# Patient Record
Sex: Female | Born: 1990 | Race: White | Hispanic: Yes | Marital: Married | Smoking: Never smoker
Health system: Southern US, Community
[De-identification: ages and names within clinical notes are randomized; demographics above are authoritative.]

---

## 2020-03-27 ENCOUNTER — Emergency Department (HOSPITAL_COMMUNITY)
Admission: EM | Admit: 2020-03-27 | Discharge: 2020-03-27 | Disposition: A | Discharge status: Home / Self Care | Payer: Self-pay | Attending: Emergency Medicine | Admitting: Emergency Medicine

## 2020-03-27 ENCOUNTER — Emergency Department (HOSPITAL_COMMUNITY): Payer: Self-pay

## 2020-03-27 ENCOUNTER — Encounter (HOSPITAL_COMMUNITY): Payer: Self-pay | Admitting: Emergency Medicine

## 2020-03-27 DIAGNOSIS — S0990XA Unspecified injury of head, initial encounter: Secondary | ICD-10-CM | POA: Insufficient documentation

## 2020-03-27 DIAGNOSIS — S42109A Fracture of unspecified part of scapula, unspecified shoulder, initial encounter for closed fracture: Secondary | ICD-10-CM

## 2020-03-27 DIAGNOSIS — S42021A Displaced fracture of shaft of right clavicle, initial encounter for closed fracture: Secondary | ICD-10-CM | POA: Insufficient documentation

## 2020-03-27 DIAGNOSIS — S22009A Unspecified fracture of unspecified thoracic vertebra, initial encounter for closed fracture: Secondary | ICD-10-CM

## 2020-03-27 DIAGNOSIS — Y9289 Other specified places as the place of occurrence of the external cause: Secondary | ICD-10-CM | POA: Insufficient documentation

## 2020-03-27 DIAGNOSIS — Y9301 Activity, walking, marching and hiking: Secondary | ICD-10-CM | POA: Insufficient documentation

## 2020-03-27 DIAGNOSIS — S129XXA Fracture of neck, unspecified, initial encounter: Secondary | ICD-10-CM

## 2020-03-27 DIAGNOSIS — W108XXA Fall (on) (from) other stairs and steps, initial encounter: Secondary | ICD-10-CM | POA: Insufficient documentation

## 2020-03-27 MED ORDER — HYDROCODONE-ACETAMINOPHEN 5-325 MG PO TABS
1.0000 | ORAL_TABLET | Freq: Four times a day (QID) | ORAL | 0 refills | Status: DC | PRN
Start: 2020-03-27 — End: 2020-04-22

## 2020-03-27 NOTE — Discharge Instructions (Addendum)
Llama la ofcina debajo para dar una cita por el [place].  [place] el esling por dolor. [place] hielo, 20 minutes 3 veces cada dia.  Toma ibuprofen 3 veces cada dia. Tylenol si necesita mas medicina.  Toma norco si tiene Facilities manager. Ten cuidado, puede causar cansado.  Regresa a la sala de emergencia si no puede sentir el mano, si el piel de mano cambia color, o con nuevos sintomas.

## 2020-03-27 NOTE — ED Provider Notes (Signed)
[facility] EMERGENCY DEPARTMENT Provider Note   CSN: [id number] Arrival date & time: 03/27/20  1227     History Chief Complaint  Patient presents with   Fall   Shoulder Pain    Dareen Gutzwiller is a 29 y.o. female presenting for evaluation of right shoulder pain.  Patient states on Wednesday she fell down the stairs, landing on her left side.  She did hit her head, but did not lose consciousness.  She was able to stand up and ambulate immediately after the fall.  Since then, she has had persistent right shoulder pain.  Pain is minimal at rest, but severe with any movement.  She bought something over-the-counter for pain, but does not remember what it was.  She denies numbness or tingling.  She denies pain in her neck or her back.  She denies numbness or tingling.  She has never seen orthopedics before.  She denies assault or intentional injury.  She feels safe at home.  HPI     History reviewed. No pertinent past medical history.  There are no problems to display for this patient.   History reviewed. No pertinent surgical history.   OB History   No obstetric history on file.     No family history on file.  Social History   Tobacco Use   Smoking status: Never Smoker   Smokeless tobacco: Never Used  Substance Use Topics   Alcohol use: Not Currently   Drug use: Not Currently    Home Medications Prior to Admission medications   Not on File    Allergies    Patient has no allergy information on record.  Review of Systems   Review of Systems  Musculoskeletal: Positive for arthralgias.  Hematological: Does not bruise/bleed easily.  All other systems reviewed and are negative.   Physical Exam Updated Vital Signs BP 114/65 (BP Location: Left Arm)    Pulse 80    Temp 98.6 F (37 C) (Oral)    Resp 20    LMP 03/22/2020    SpO2 100%   Physical Exam Vitals and nursing note reviewed.  Constitutional:      General: She is not in  acute distress.    Appearance: She is well-developed.     Comments: Resting in the bed in NAD  HENT:     Head: Normocephalic.      Comments: Contusion under the right eye.  Healing laceration over the left eyebrow and along the bridge of the left side of the nose.  No trismus or malocclusion. Eyes:     Extraocular Movements: Extraocular movements intact.     Conjunctiva/sclera: Conjunctivae normal.     Pupils: Pupils are equal, round, and reactive to light.  Neck:     Comments: No ttp of midline c-spine Cardiovascular:     Rate and Rhythm: Normal rate and regular rhythm.     Pulses: Normal pulses.  Pulmonary:     Effort: Pulmonary effort is normal. No respiratory distress.     Breath sounds: Normal breath sounds. No wheezing.     Comments: Clear lung sounds. No ttp of the chest wall Chest:     Chest wall: No tenderness.  Abdominal:     General: There is no distension.     Palpations: Abdomen is soft. There is no mass.     Tenderness: There is no abdominal tenderness. There is no guarding or rebound.     Comments: No ttp  Musculoskeletal:  General: Swelling and tenderness present.     Cervical back: Normal range of motion and neck supple.     Comments: Swelling and tenderness palpation of the right shoulder.  Pain mostly along the clavicle and posterior shoulder, no pain over the humerus. Mild bruising and tenderness over the right ulnar wrist, however full active range of motion without difficulty.  Radial pulse 2+.  Good distal sensation and cap refill of the right upper extremity.  Full active range of motion of the elbow without difficulty. No TTP of midline spine or left side back.  Skin:    General: Skin is warm and dry.     Capillary Refill: Capillary refill takes less than 2 seconds.  Neurological:     Mental Status: She is alert and oriented to person, place, and time.     ED Results / Procedures / Treatments   Labs (all labs ordered are listed, but only  abnormal results are displayed) Labs Reviewed - No data to display  EKG None  Radiology DG Shoulder Right  Result Date: 03/27/2020 CLINICAL DATA:  Shoulder pain status post fall. EXAM: RIGHT SHOULDER - 2+ VIEW COMPARISON:  None. FINDINGS: There is a dislocated right mid clavicle fracture. Additionally, there appears to be fracture of superior aspect of the right scapula. Visualized right hemithorax is unremarkable. IMPRESSION: 1. Displaced right mid clavicle fracture. 2. Probable fracture of the superior aspect of the right scapula. Displaced right mid clavicle fracture. 3. Additionally, probable fracture of the superior aspect of the right scapula. Recommend further evaluation with shoulder CT. Electronically Signed   By: Annia Belt M.D.   On: 03/27/2020 14:18    Procedures Procedures (including critical care time)  Medications Ordered in ED Medications - No data to display  ED Course  I have reviewed the triage vital signs and the nursing notes.  Pertinent labs & imaging results that were available during my care of the patient were reviewed by me and considered in my medical decision making (see chart for details).    MDM Rules/Calculators/A&P                          Patient resenting for evaluation of right shoulder pain after fall 3 days ago.  On exam, patient is neurovascular intact, however does have tenderness of the clavicle and scapula.  X-ray obtained from triage viewed interpreted by me, showed just displaced clavicular fracture as well as what appears to be a scapular fracture.  Will obtain CT due to degree of displacement and consult with Ortho.  Patient states she does not need anything for pain when she is at rest.  Discussed with Dr. Carola Frost, orthopedics, who request 3D reconstruction.  Recommend patient be placed in a sling and follow-up in the office on Wednesday.  CT shows displaced R clavicle fx, nondisplaced fx of the spine, and multiple R transverse fxs of the  spine. Will add on dedicated c-spine and ct head imaging.  Disucssed results of shoulder CT with pt.   Pt signed out to Burnett Corrente, PA-C for f/u on head and c-spine ct. Consider neurosurgery consult as needed prior to d/c.   Final Clinical Impression(s) / ED Diagnoses Final diagnoses:  None    Rx / DC Orders ED Discharge Orders    None       Alveria Apley, PA-C 03/27/20 1621    Tilden Fossa, MD 03/27/20 1735

## 2020-03-27 NOTE — ED Provider Notes (Signed)
Care assumed from PA Sophia Caccavale at shift change, please see her note for full details, but in brief Alexandra Whitaker is a 29 y.o. female presents after she reports that she fell down the stairs 3 days ago.  Patient found to have right clavicle and scapular fractures as well as transverse process fractures of C7, T1 and T2.  Patient has some bruising beneath the right eye and an old healed cut above the left.  She does not have any tenderness over the chest or abdomen.  She is neurologically intact with no numbness or weakness.  Dedicated CTs of the head and C-spine pending at shift change.  Patient repeatedly states that she fell down the stairs and denies anyone hurting her, states that she feels safe to go home and does not need resources for safety.  DG Shoulder Right  Result Date: 03/27/2020 CLINICAL DATA:  Shoulder pain status post fall. EXAM: RIGHT SHOULDER - 2+ VIEW COMPARISON:  None. FINDINGS: There is a dislocated right mid clavicle fracture. Additionally, there appears to be fracture of superior aspect of the right scapula. Visualized right hemithorax is unremarkable. IMPRESSION: 1. Displaced right mid clavicle fracture. 2. Probable fracture of the superior aspect of the right scapula. Displaced right mid clavicle fracture. 3. Additionally, probable fracture of the superior aspect of the right scapula. Recommend further evaluation with shoulder CT. Electronically Signed   By: Annia Belt M.D.   On: 03/27/2020 14:18   CT Head Wo Contrast  Result Date: 03/27/2020 CLINICAL DATA:  Head trauma, mod-severe EXAM: CT HEAD WITHOUT CONTRAST TECHNIQUE: Contiguous axial images were obtained from the base of the skull through the vertex without intravenous contrast. COMPARISON:  None. FINDINGS: Brain: No intracranial hemorrhage, mass effect, or midline shift. No hydrocephalus. Incidental mega cisterna magna. The basilar cisterns are patent. No evidence of territorial infarct or acute ischemia. No  extra-axial or intracranial fluid collection. Vascular: No hyperdense vessel or unexpected calcification. Skull: Normal. Negative for fracture or focal lesion. Sinuses/Orbits: Mucosal thickening of the right maxillary sinus and scattered ethmoid air cells. Mastoid air cells are clear. No acute orbital findings. Other: None. IMPRESSION: 1. No acute intracranial abnormality. No skull fracture. 2. Mild paranasal sinus disease. Electronically Signed   By: Narda Rutherford M.D.   On: 03/27/2020 16:51   CT Cervical Spine Wo Contrast  Result Date: 03/27/2020 CLINICAL DATA:  Fall on Wednesday. Spinal fracture seen on CT of right shoulder. EXAM: CT CERVICAL SPINE WITHOUT CONTRAST TECHNIQUE: Multidetector CT imaging of the cervical spine was performed without intravenous contrast. Multiplanar CT image reconstructions were also generated. COMPARISON:  None. FINDINGS: Alignment: Straightening of normal lordosis. No traumatic subluxation. Skull base and vertebrae: Acute fractures of right C7, T1, and T2 transverse process. No extension to the lamina or anterior column. No additional fracture of the cervical spine. Dens and skull base are intact. Soft tissues and spinal canal: No prevertebral fluid or swelling. No visible canal hematoma. Disc levels:  Disc spaces are preserved. Upper chest: No acute findings.  No pneumothorax. Other: None. IMPRESSION: Acute fractures of right C7, T1, and T2 transverse process. No extension to the lamina or anterior column. No additional fracture of the cervical spine. Electronically Signed   By: Narda Rutherford M.D.   On: 03/27/2020 16:48   CT Shoulder Right Wo Contrast  Result Date: 03/27/2020 CLINICAL DATA:  Right shoulder trauma. Evaluate scapula and clavicle fractures. EXAM: CT OF THE UPPER RIGHT EXTREMITY WITHOUT CONTRAST TECHNIQUE: Multidetector CT imaging of the  upper right extremity was performed according to the standard protocol. COMPARISON:  Radiographs, same date. FINDINGS:  Displaced mid right clavicle fracture is again demonstrated. Marked elevation of the proximal fragment. The [facility] joint is intact and the coracoclavicular ligaments are intact. The fragments are overlapping by approximately 2.4 cm. Nondisplaced fracture involving the scapular spine. There is also a mildly comminuted but nondisplaced fracture of the scapular body. No definite rib fractures are identified but there are nondisplaced fractures involving the right transverse processes of C7, T1 and T2. Grossly by CT the rotator cuff tendons are intact. The biceps tendon appears intact. The right lung is clear.  No pulmonary contusion or pneumothorax. IMPRESSION: 1. Displaced mid right clavicle fracture. Marked elevation of the proximal fragment. 2. Nondisplaced fracture involving the scapular spine. 3. Mildly comminuted but nondisplaced fracture of the scapular body. 4. Nondisplaced fractures involving the right transverse processes of C7, T1 and T2. 5. Grossly by CT the rotator cuff tendons are intact. Electronically Signed   By: Rudie Meyer M.D.   On: 03/27/2020 15:57   CT 3D RECON AT SCANNER  Result Date: 03/27/2020 CLINICAL DATA:  Right shoulder trauma. Evaluate scapula and clavicle fractures. EXAM: CT OF THE UPPER RIGHT EXTREMITY WITHOUT CONTRAST TECHNIQUE: Multidetector CT imaging of the upper right extremity was performed according to the standard protocol. COMPARISON:  Radiographs, same date. FINDINGS: Displaced mid right clavicle fracture is again demonstrated. Marked elevation of the proximal fragment. The [facility] joint is intact and the coracoclavicular ligaments are intact. The fragments are overlapping by approximately 2.4 cm. Nondisplaced fracture involving the scapular spine. There is also a mildly comminuted but nondisplaced fracture of the scapular body. No definite rib fractures are identified but there are nondisplaced fractures involving the right transverse processes of C7, T1 and T2. Grossly by CT the  rotator cuff tendons are intact. The biceps tendon appears intact. The right lung is clear.  No pulmonary contusion or pneumothorax. IMPRESSION: 1. Displaced mid right clavicle fracture. Marked elevation of the proximal fragment. 2. Nondisplaced fracture involving the scapular spine. 3. Mildly comminuted but nondisplaced fracture of the scapular body. 4. Nondisplaced fractures involving the right transverse processes of C7, T1 and T2. 5. Grossly by CT the rotator cuff tendons are intact. Electronically Signed   By: Rudie Meyer M.D.   On: 03/27/2020 15:57     CTs of the cervical spine and head with no other acute injury.  Fractures of the right C7, T1 and T2 transverse processes again noted with no extension to the lamina or anterior column, no additional fractures of the C-spine noted.  I discussed these injuries with Dr. Maisie Fus with neurosurgery that states that this does not require any c-collar or bracing and that these should heal fine on their own they are not unstable and will not require any surgical intervention.  Patient can follow-up with her PCP regarding this.  Patient has been prescribed Norco and encouraged to use ibuprofen for pain management and has follow-up with Dr. Carola Frost for clavicle and scapular fracture.  Using Spanish interpreter I again confirmed with patient that she feels safe going home, her brother is waiting here in the parking lot for her.  She denies any concern for her safety.  She expresses understanding with pain medication and plan for follow-up.  Shoulder sling intact.  Discharged home in good condition.    Dartha Lodge, PA-C 03/27/20 1750    Tilden Fossa, MD 03/27/20 1815

## 2020-03-27 NOTE — ED Notes (Signed)
Patient verbalizes understanding of discharge instructions. Opportunity for questioning and answers were provided. Pt discharged from ED. 

## 2020-03-27 NOTE — ED Triage Notes (Signed)
Pt. Stated, I fell on Wednesday and feel like I broke my rt. Shoulder.

## 2020-03-27 NOTE — Progress Notes (Signed)
Orthopedic Tech Progress Note Patient Details:  Alexandra Whitaker [birth year] [id number] Sling Immobilizer applied by ED tech Patient ID: [name], female   DOB: [birth year], 29 y.o.   MRN: [id number]   [name] 03/27/2020, 3:46 PM

## 2020-04-17 ENCOUNTER — Inpatient Hospital Stay (HOSPITAL_COMMUNITY): Admission: RE | Admit: 2020-04-17 | Payer: Self-pay | Source: Ambulatory Visit

## 2020-04-19 NOTE — Progress Notes (Signed)
[employer]  ID [phone] to update patient on new surgery start time of  9:47 am and arrival time at [facility] at 715 am.

## 2020-04-19 NOTE — H&P (Signed)
Orthopaedic Trauma Service (OTS) Consult/H&P   Patient ID: Alexandra Whitaker MRN: [id number] DOB/AGE: 1990/09/16 29 y.o.   HPI: Alexandra Whitaker is an 29 y.o. RHD female who sustained a right clavicle fracture approximately 3 weeks ago.  Patient was seen in the ED and placed into a sling.  She was instructed to follow-up with orthopedics within a week however she did not present to our office until last week.  Follow-up x-rays were obtained which showed over 300% displacement along with a pretty profound shortening.  Patient is quite uncomfortable.  We discussed risks and benefits of surgery versus nonoperative management and she wished to proceed with surgical intervention.  Patient presents today for open reduction internal fixation of her right clavicle.  Comprehensive H&P done as part of her evaluation in the office.  No acute changes since being seen in the office.  No numbness or tingling in her right upper extremity.  She has been using her sling.  Pain is tolerable.  No past medical history on file.  No past surgical history on file.  No family history on file.  Social History:  reports that she has never smoked. She has never used smokeless tobacco. She reports previous alcohol use. She reports previous drug use.  Allergies: Not on File  Medications: I have reviewed the patient's current medications. No outpatient medications have been marked as taking for the 04/20/20 encounter [facility] Encounter).     No results found for this or any previous visit (from the past 48 hour(s)).  No results found.  Intake/Output    None      Review of Systems  Constitutional: Negative for chills and fever.  Respiratory: Negative for shortness of breath and wheezing.   Cardiovascular: Negative for chest pain and palpitations.  Gastrointestinal: Negative for abdominal pain, nausea and vomiting.  Musculoskeletal:       R shoulder pain   Neurological: Negative for  tingling and sensory change.   Last menstrual period 03/22/2020. Physical Exam Vitals and nursing note reviewed.  Constitutional:      General: She is awake. She is not in acute distress.    Appearance: Normal appearance. She is well-developed, well-groomed and normal weight.  HENT:     Head: Normocephalic and atraumatic.  Eyes:     Extraocular Movements: Extraocular movements intact.  Cardiovascular:     Rate and Rhythm: Normal rate and regular rhythm.  Pulmonary:     Effort: Pulmonary effort is normal.     Breath sounds: Normal breath sounds.  Musculoskeletal:     Comments: Right upper extremity  + deformity of R shoulder    + shortening of R shoulder girdle    + elevation of R shoulder     + asymmetry when compared to contra-lateral side  No complex wounds No skin tenting  Skin mobile over fracture side mid clavicle Fracture is mobile +TTP with palpation  Elbow, forearm, wrist and hand are nontender + radial pulse Radial, ulnar, median nv motor and sensory functions intact   Skin:    General: Skin is warm.     Capillary Refill: Capillary refill takes less than 2 seconds.  Neurological:     Mental Status: She is alert and oriented to person, place, and time.  Psychiatric:        Attention and Perception: Attention and perception normal.        Mood and Affect: Mood and affect normal.  Behavior: Behavior is cooperative.    Xrays R clavicle      Shortened, displaced and comminuted midshaft R clavicle fracture.  Approximately 300% caudal displacement   Assessment/Plan:  29 y/o RHD female with displaced R clavicle fracture   - R clavicle fracture   OR for ORIF   outpt procedure  Risks and benefits reviewed with pt, wishes to proceed with ORIF   ROM as tolerated post op, sling for comfort   - Dispo:  OR for ORIF R clavicle    Mearl Latin, PA-C [phone] (C) 04/19/2020, 1:55 PM  [facility] [address] [place]  [address] [phone] [phone] (F)    After 5pm and on the weekends please log on to Amion, go to orthopaedics and the look under the [facility] Call for the provider(s) on call. You can also call our office at [phone] and then follow the prompts to be connected to the call team.

## 2020-04-20 ENCOUNTER — Ambulatory Visit (HOSPITAL_COMMUNITY): Payer: Self-pay

## 2020-04-20 ENCOUNTER — Ambulatory Visit (HOSPITAL_COMMUNITY)
Admission: RE | Admit: 2020-04-20 | Discharge: 2020-04-20 | Disposition: A | Discharge status: Home / Self Care | Payer: Self-pay | Attending: Orthopedic Surgery | Admitting: Orthopedic Surgery

## 2020-04-20 ENCOUNTER — Ambulatory Visit (HOSPITAL_COMMUNITY): Payer: Self-pay | Admitting: Certified Registered Nurse Anesthetist

## 2020-04-20 ENCOUNTER — Encounter (HOSPITAL_COMMUNITY): Payer: Self-pay | Admitting: Orthopedic Surgery

## 2020-04-20 ENCOUNTER — Encounter (HOSPITAL_COMMUNITY)
Admission: RE | Disposition: A | Discharge status: Home / Self Care | Payer: Self-pay | Source: Home / Self Care | Attending: Orthopedic Surgery

## 2020-04-20 ENCOUNTER — Other Ambulatory Visit: Payer: Self-pay

## 2020-04-20 DIAGNOSIS — T148XXA Other injury of unspecified body region, initial encounter: Secondary | ICD-10-CM

## 2020-04-20 DIAGNOSIS — S42021A Displaced fracture of shaft of right clavicle, initial encounter for closed fracture: Secondary | ICD-10-CM | POA: Insufficient documentation

## 2020-04-20 DIAGNOSIS — W109XXA Fall (on) (from) unspecified stairs and steps, initial encounter: Secondary | ICD-10-CM | POA: Insufficient documentation

## 2020-04-20 DIAGNOSIS — Z20822 Contact with and (suspected) exposure to covid-19: Secondary | ICD-10-CM | POA: Insufficient documentation

## 2020-04-20 DIAGNOSIS — Z419 Encounter for procedure for purposes other than remedying health state, unspecified: Secondary | ICD-10-CM

## 2020-04-20 HISTORY — PX: ORIF CLAVICULAR FRACTURE: SHX5055

## 2020-04-20 LAB — CBC
HCT: 41.8 % (ref 36.0–46.0)
Hemoglobin: 12.9 g/dL (ref 12.0–15.0)
MCH: 25.2 pg — ABNORMAL LOW (ref 26.0–34.0)
MCHC: 30.9 g/dL (ref 30.0–36.0)
MCV: 81.6 fL (ref 80.0–100.0)
Platelets: 229 10*3/uL (ref 150–400)
RBC: 5.12 MIL/uL — ABNORMAL HIGH (ref 3.87–5.11)
RDW: 13.1 % (ref 11.5–15.5)
WBC: 7.2 10*3/uL (ref 4.0–10.5)
nRBC: 0 % (ref 0.0–0.2)

## 2020-04-20 LAB — COMPREHENSIVE METABOLIC PANEL
ALT: 10 U/L (ref 0–44)
AST: 16 U/L (ref 15–41)
Albumin: 4.1 g/dL (ref 3.5–5.0)
Alkaline Phosphatase: 58 U/L (ref 38–126)
Anion gap: 11 (ref 5–15)
BUN: 10 mg/dL (ref 6–20)
CO2: 23 mmol/L (ref 22–32)
Calcium: 9.2 mg/dL (ref 8.9–10.3)
Chloride: 103 mmol/L (ref 98–111)
Creatinine, Ser: 0.81 mg/dL (ref 0.44–1.00)
GFR, Estimated: >60 mL/min (ref >60)
Glucose, Bld: 101 mg/dL — ABNORMAL HIGH (ref 70–99)
Potassium: 3.8 mmol/L (ref 3.5–5.1)
Sodium: 137 mmol/L (ref 135–145)
Total Bilirubin: 0.5 mg/dL (ref 0.3–1.2)
Total Protein: 7.1 g/dL (ref 6.5–8.1)

## 2020-04-20 LAB — POCT PREGNANCY, URINE: Preg Test, Ur: NEGATIVE

## 2020-04-20 LAB — SARS CORONAVIRUS 2 BY RT PCR (HOSPITAL ORDER, PERFORMED IN ~~LOC~~ HOSPITAL LAB): SARS Coronavirus 2: NEGATIVE

## 2020-04-20 LAB — VITAMIN D 25 HYDROXY (VIT D DEFICIENCY, FRACTURES): Vit D, 25-Hydroxy: 29.58 ng/mL — ABNORMAL LOW (ref 30–100)

## 2020-04-20 SURGERY — OPEN REDUCTION INTERNAL FIXATION (ORIF) CLAVICULAR FRACTURE
Anesthesia: General | Site: Chest | Laterality: Right

## 2020-04-20 MED ORDER — ROCURONIUM BROMIDE 10 MG/ML (PF) SYRINGE
PREFILLED_SYRINGE | INTRAVENOUS | Status: AC
  Filled 2020-04-20: qty 10

## 2020-04-20 MED ORDER — OXYCODONE HCL 5 MG/5ML PO SOLN: 5.0000 mg | Freq: Once | ORAL | Status: DC | PRN

## 2020-04-20 MED ORDER — KETOROLAC TROMETHAMINE 30 MG/ML IJ SOLN: 30.0000 mg | Freq: Once | INTRAMUSCULAR | Status: AC | PRN

## 2020-04-20 MED ORDER — CEFAZOLIN SODIUM-DEXTROSE 2-4 GM/100ML-% IV SOLN
2.0000 g | INTRAVENOUS | Status: AC
  Administered 2020-04-20: 2 g via INTRAVENOUS
  Filled 2020-04-20: qty 100

## 2020-04-20 MED ORDER — FENTANYL CITRATE (PF) 250 MCG/5ML IJ SOLN
INTRAMUSCULAR | Status: AC
  Filled 2020-04-20: qty 5

## 2020-04-20 MED ORDER — GABAPENTIN 300 MG PO CAPS
300.0000 mg | ORAL_CAPSULE | Freq: Once | ORAL | Status: AC
  Administered 2020-04-20: 300 mg via ORAL
  Filled 2020-04-20: qty 1

## 2020-04-20 MED ORDER — OXYCODONE HCL 5 MG PO TABS: 5.0000 mg | ORAL_TABLET | Freq: Once | ORAL | Status: DC | PRN

## 2020-04-20 MED ORDER — DEXAMETHASONE SODIUM PHOSPHATE 10 MG/ML IJ SOLN
INTRAMUSCULAR | Status: DC | PRN
  Administered 2020-04-20: 8 mg via INTRAVENOUS

## 2020-04-20 MED ORDER — HYDROCODONE-ACETAMINOPHEN 5-325 MG PO TABS
1.0000 | ORAL_TABLET | Freq: Four times a day (QID) | ORAL | 0 refills | Status: AC | PRN
Start: 2020-04-20 — End: ?

## 2020-04-20 MED ORDER — ONDANSETRON 4 MG PO TBDP: 4.0000 mg | ORAL_TABLET | Freq: Three times a day (TID) | ORAL | 0 refills | Status: AC | PRN

## 2020-04-20 MED ORDER — CELECOXIB 200 MG PO CAPS
400.0000 mg | ORAL_CAPSULE | Freq: Once | ORAL | Status: AC
  Administered 2020-04-20: 400 mg via ORAL
  Filled 2020-04-20: qty 2

## 2020-04-20 MED ORDER — ONDANSETRON HCL 4 MG/2ML IJ SOLN
INTRAMUSCULAR | Status: AC
  Filled 2020-04-20: qty 2

## 2020-04-20 MED ORDER — LACTATED RINGERS IV SOLN: INTRAVENOUS | Status: DC

## 2020-04-20 MED ORDER — LIDOCAINE 2% (20 MG/ML) 5 ML SYRINGE
INTRAMUSCULAR | Status: DC | PRN
  Administered 2020-04-20: 60 mg via INTRAVENOUS

## 2020-04-20 MED ORDER — 0.9 % SODIUM CHLORIDE (POUR BTL) OPTIME
TOPICAL | Status: DC | PRN
  Administered 2020-04-20: 1000 mL

## 2020-04-20 MED ORDER — CHLORHEXIDINE GLUCONATE 0.12 % MT SOLN
15.0000 mL | Freq: Once | OROMUCOSAL | Status: AC
  Administered 2020-04-20: 15 mL via OROMUCOSAL
  Filled 2020-04-20: qty 15

## 2020-04-20 MED ORDER — ROCURONIUM BROMIDE 10 MG/ML (PF) SYRINGE
PREFILLED_SYRINGE | INTRAVENOUS | Status: DC | PRN
  Administered 2020-04-20: 100 mg via INTRAVENOUS

## 2020-04-20 MED ORDER — ORAL CARE MOUTH RINSE: 15.0000 mL | Freq: Once | OROMUCOSAL | Status: AC

## 2020-04-20 MED ORDER — VITAMIN D 125 MCG (5000 UT) PO CAPS: 1.0000 | ORAL_CAPSULE | Freq: Every day | ORAL | 5 refills | Status: AC

## 2020-04-20 MED ORDER — ONDANSETRON HCL 4 MG/2ML IJ SOLN
INTRAMUSCULAR | Status: DC | PRN
  Administered 2020-04-20: 4 mg via INTRAVENOUS

## 2020-04-20 MED ORDER — MIDAZOLAM HCL 2 MG/2ML IJ SOLN
INTRAMUSCULAR | Status: AC
  Filled 2020-04-20: qty 2

## 2020-04-20 MED ORDER — HYDROMORPHONE HCL 1 MG/ML IJ SOLN: 0.2500 mg | INTRAMUSCULAR | Status: DC | PRN

## 2020-04-20 MED ORDER — FENTANYL CITRATE (PF) 100 MCG/2ML IJ SOLN
INTRAMUSCULAR | Status: DC | PRN
  Administered 2020-04-20 (×2): 50 ug via INTRAVENOUS
  Administered 2020-04-20: 100 ug via INTRAVENOUS
  Administered 2020-04-20: 50 ug via INTRAVENOUS

## 2020-04-20 MED ORDER — PHENYLEPHRINE 40 MCG/ML (10ML) SYRINGE FOR IV PUSH (FOR BLOOD PRESSURE SUPPORT)
PREFILLED_SYRINGE | INTRAVENOUS | Status: AC
  Filled 2020-04-20: qty 10

## 2020-04-20 MED ORDER — PROMETHAZINE HCL 25 MG/ML IJ SOLN: 6.2500 mg | INTRAMUSCULAR | Status: DC | PRN

## 2020-04-20 MED ORDER — SODIUM CHLORIDE (PF) 0.9 % IJ SOLN
INTRAMUSCULAR | Status: DC | PRN
  Administered 2020-04-20: 20 mL

## 2020-04-20 MED ORDER — LIDOCAINE 2% (20 MG/ML) 5 ML SYRINGE
INTRAMUSCULAR | Status: AC
  Filled 2020-04-20: qty 5

## 2020-04-20 MED ORDER — PROPOFOL 10 MG/ML IV BOLUS
INTRAVENOUS | Status: DC | PRN
  Administered 2020-04-20: 120 mg via INTRAVENOUS

## 2020-04-20 MED ORDER — VITAMIN C 1000 MG PO TABS: 1000.0000 mg | ORAL_TABLET | Freq: Every day | ORAL | 0 refills | Status: AC

## 2020-04-20 MED ORDER — ACETAMINOPHEN 500 MG PO TABS
1000.0000 mg | ORAL_TABLET | Freq: Once | ORAL | Status: AC
  Administered 2020-04-20: 1000 mg via ORAL
  Filled 2020-04-20: qty 2

## 2020-04-20 MED ORDER — MIDAZOLAM HCL 2 MG/2ML IJ SOLN
INTRAMUSCULAR | Status: DC | PRN
  Administered 2020-04-20: 2 mg via INTRAVENOUS

## 2020-04-20 MED ORDER — DEXMEDETOMIDINE (PRECEDEX) IN NS 20 MCG/5ML (4 MCG/ML) IV SYRINGE
PREFILLED_SYRINGE | INTRAVENOUS | Status: DC | PRN
  Administered 2020-04-20: 12 ug via INTRAVENOUS
  Administered 2020-04-20: 8 ug via INTRAVENOUS

## 2020-04-20 MED ORDER — BUPIVACAINE LIPOSOME 1.3 % IJ SUSP
20.0000 mL | Freq: Once | INTRAMUSCULAR | Status: AC
  Administered 2020-04-20: 20 mL
  Filled 2020-04-20: qty 20

## 2020-04-20 MED ORDER — DEXAMETHASONE SODIUM PHOSPHATE 10 MG/ML IJ SOLN
INTRAMUSCULAR | Status: AC
  Filled 2020-04-20: qty 1

## 2020-04-20 MED ORDER — METHOCARBAMOL 500 MG PO TABS
500.0000 mg | ORAL_TABLET | Freq: Three times a day (TID) | ORAL | 0 refills | Status: AC | PRN
Start: 2020-04-20 — End: ?

## 2020-04-20 MED ORDER — SUGAMMADEX SODIUM 200 MG/2ML IV SOLN
INTRAVENOUS | Status: DC | PRN
  Administered 2020-04-20: 200 mg via INTRAVENOUS

## 2020-04-20 MED ORDER — MEPERIDINE HCL 25 MG/ML IJ SOLN: 6.2500 mg | INTRAMUSCULAR | Status: DC | PRN

## 2020-04-20 MED ORDER — PHENYLEPHRINE 40 MCG/ML (10ML) SYRINGE FOR IV PUSH (FOR BLOOD PRESSURE SUPPORT)
PREFILLED_SYRINGE | INTRAVENOUS | Status: DC | PRN
  Administered 2020-04-20: 80 ug via INTRAVENOUS

## 2020-04-20 SURGICAL SUPPLY — 53 items
BENZOIN TINCTURE PRP APPL 2/3 (GAUZE/BANDAGES/DRESSINGS) ×3 IMPLANT
BIT DRILL 2.0 LNG QUCK RELEASE (BIT) ×1 IMPLANT
BIT DRILL 2.8 QUICK RELEASE (BIT) ×1 IMPLANT
BRUSH SCRUB EZ PLAIN DRY (MISCELLANEOUS) ×6 IMPLANT
CLOSURE WOUND 1/2 X4 (GAUZE/BANDAGES/DRESSINGS) ×1
COVER SURGICAL LIGHT HANDLE (MISCELLANEOUS) ×3 IMPLANT
COVER WAND RF STERILE (DRAPES) ×3 IMPLANT
DRAPE C-ARM 42X72 X-RAY (DRAPES) ×3 IMPLANT
DRAPE INCISE IOBAN 66X45 STRL (DRAPES) ×3 IMPLANT
DRAPE ORTHO SPLIT 77X108 STRL (DRAPES) ×4
DRAPE SURG ORHT 6 SPLT 77X108 (DRAPES) ×2 IMPLANT
DRAPE U-SHAPE 47X51 STRL (DRAPES) ×3 IMPLANT
DRILL 2.0 LNG QUICK RELEASE (BIT) ×3
DRILL 2.8 QUICK RELEASE (BIT) ×3
DRSG MEPILEX BORDER 4X8 (GAUZE/BANDAGES/DRESSINGS) ×3 IMPLANT
ELECT REM PT RETURN 9FT ADLT (ELECTROSURGICAL) ×3
ELECTRODE REM PT RTRN 9FT ADLT (ELECTROSURGICAL) ×1 IMPLANT
GLOVE BIO SURGEON STRL SZ7.5 (GLOVE) ×6 IMPLANT
GLOVE BIO SURGEON STRL SZ8 (GLOVE) ×3 IMPLANT
GLOVE BIOGEL PI IND STRL 7.5 (GLOVE) ×2 IMPLANT
GLOVE BIOGEL PI IND STRL 8 (GLOVE) ×1 IMPLANT
GLOVE BIOGEL PI INDICATOR 7.5 (GLOVE) ×4
GLOVE BIOGEL PI INDICATOR 8 (GLOVE) ×2
GOWN STRL REUS W/ TWL LRG LVL3 (GOWN DISPOSABLE) ×3 IMPLANT
GOWN STRL REUS W/ TWL XL LVL3 (GOWN DISPOSABLE) ×1 IMPLANT
GOWN STRL REUS W/TWL LRG LVL3 (GOWN DISPOSABLE) ×6
GOWN STRL REUS W/TWL XL LVL3 (GOWN DISPOSABLE) ×2
KIT BASIN OR (CUSTOM PROCEDURE TRAY) ×3 IMPLANT
KIT TURNOVER KIT B (KITS) ×3 IMPLANT
MANIFOLD NEPTUNE II (INSTRUMENTS) ×3 IMPLANT
NEEDLE HYPO 25GX1X1/2 BEV (NEEDLE) ×3 IMPLANT
NS IRRIG 1000ML POUR BTL (IV SOLUTION) ×3 IMPLANT
PACK GENERAL/GYN (CUSTOM PROCEDURE TRAY) ×3 IMPLANT
PAD ARMBOARD 7.5X6 YLW CONV (MISCELLANEOUS) ×6 IMPLANT
PLATE DIST CLAV 2.3 16H RT (Plate) ×3 IMPLANT
SCREW 2.3X12MM (Screw) ×3 IMPLANT
SCREW CORTICAL 2.3X10MM (Screw) ×3 IMPLANT
SCREW CORTICAL LOCKING 2.3X14M (Screw) ×3 IMPLANT
SCREW HEXALOBE NON-LOCK 3.5X14 (Screw) ×3 IMPLANT
SCREW NON LOCK 3.5X10MM (Screw) ×3 IMPLANT
SCREW NON-TOGGLING 2.3X10 (Screw) ×3 IMPLANT
SCREW NONLOCK HEX 3.5X12 (Screw) ×9 IMPLANT
SLING ARM FOAM STRAP MED (SOFTGOODS) ×3 IMPLANT
SLING ARM IMMOBILIZER LRG (SOFTGOODS) IMPLANT
SLING ARM IMMOBILIZER MED (SOFTGOODS) IMPLANT
STAPLER VISISTAT 35W (STAPLE) ×3 IMPLANT
STRIP CLOSURE SKIN 1/2X4 (GAUZE/BANDAGES/DRESSINGS) ×2 IMPLANT
SUCTION FRAZIER HANDLE 10FR (MISCELLANEOUS) ×2
SUCTION TUBE FRAZIER 10FR DISP (MISCELLANEOUS) ×1 IMPLANT
SYR CONTROL 10ML LL (SYRINGE) ×3 IMPLANT
TOWEL GREEN STERILE (TOWEL DISPOSABLE) ×3 IMPLANT
TOWEL GREEN STERILE FF (TOWEL DISPOSABLE) ×6 IMPLANT
WATER STERILE IRR 1000ML POUR (IV SOLUTION) ×3 IMPLANT

## 2020-04-20 NOTE — Anesthesia Postprocedure Evaluation (Signed)
Anesthesia Post Note  Patient: Alexandra Whitaker  Procedure(s) Performed: OPEN REDUCTION INTERNAL FIXATION (ORIF) CLAVICULAR FRACTURE (Right Chest)     Patient location during evaluation: PACU Anesthesia Type: General Level of consciousness: awake and alert, oriented and patient cooperative Pain management: pain level controlled Vital Signs Assessment: post-procedure vital signs reviewed and stable Respiratory status: spontaneous breathing, nonlabored ventilation and respiratory function stable Cardiovascular status: blood pressure returned to baseline and stable Postop Assessment: no apparent nausea or vomiting Anesthetic complications: no   No complications documented.  Last Vitals:  Vitals:   04/20/20 1234 04/20/20 1247  BP: 117/77 121/80  Pulse: 93 91  Resp: 16 18  Temp:  36.7 C  SpO2: 100% 99%    Last Pain:  Vitals:   04/20/20 1247  TempSrc:   PainSc: 0-No pain                 Lannie Fields

## 2020-04-20 NOTE — Transfer of Care (Signed)
Immediate Anesthesia Transfer of Care Note  Patient: Alexandra Whitaker  Procedure(s) Performed: OPEN REDUCTION INTERNAL FIXATION (ORIF) CLAVICULAR FRACTURE (Right Chest)  Patient Location: PACU  Anesthesia Type:General  Level of Consciousness: awake, alert  and oriented  Airway & Oxygen Therapy: Patient Spontanous Breathing and Patient connected to nasal cannula oxygen  Post-op Assessment: Report given to RN, Post -op Vital signs reviewed and stable and Patient moving all extremities  Post vital signs: Reviewed and stable  Last Vitals:  Vitals Value Taken Time  BP 116/68 04/20/20 1219  Temp 36.6 C 04/20/20 1218  Pulse 113 04/20/20 1221  Resp 8 04/20/20 1221  SpO2 100 % 04/20/20 1221  Vitals shown include unvalidated device data.  Last Pain:  Vitals:   04/20/20 0828  TempSrc:   PainSc: 0-No pain         Complications: No complications documented.

## 2020-04-20 NOTE — Discharge Instructions (Signed)
[facility] Discharge Instructions   General Discharge Instructions  Orthopaedic Injuries:  Right clavicle fracture treated with open reduction and internal fixation using plate and screws  WEIGHT BEARING STATUS: ok to use right arm to comfort. No lifting more than 5 pounds.  Sling for comfort   RANGE OF MOTION/ACTIVITY:unrestricted range of motion Right shoulder. No lifting more than 5 pounds. Activity as tolerated while maintaining restriction  Bone health: labs show some mild vitamin d insufficiency. Take the vitamin d and vitamin c that has been prescribed for you   Wound Care: daily wound care starting on 04/23/2020. See below  Discharge Wound Care Instructions  Do NOT apply any ointments, solutions or lotions to pin sites or surgical wounds.  These prevent needed drainage and even though solutions like hydrogen peroxide kill bacteria, they also damage cells lining the pin sites that help fight infection.  Applying lotions or ointments can keep the wounds moist and can cause them to breakdown and open up as well. This can increase the risk for infection. When in doubt call the office.  Surgical incisions should be dressed daily.  If any drainage is noted, use one layer of adaptic, then gauze and tape. Alternatively you can use a mepilex type dressing (the beige dressing you have on your incision from the hospital). These can be purchased at a medical supply store [facility] in [place])   Once the incision is completely dry and without drainage, it may be left open to air out.  Showering may begin 36-48 hours later.  Cleaning gently with soap and water.   Diet: as you were eating previously.  Can use over the counter stool softeners and bowel preparations, such as Miralax, to help with bowel movements.  Narcotics can be constipating.  Be sure to drink plenty of fluids  PAIN MEDICATION USE AND EXPECTATIONS  You have likely been given narcotic medications to  help control your pain.  After a traumatic event that results in an fracture (broken bone) with or without surgery, it is ok to use narcotic pain medications to help control one's pain.  We understand that everyone responds to pain differently and each individual patient will be evaluated on a regular basis for the continued need for narcotic medications. Ideally, narcotic medication use should last no more than 6-8 weeks (coinciding with fracture healing).   As a patient it is your responsibility as well to monitor narcotic medication use and report the amount and frequency you use these medications when you come to your office visit.   We would also advise that if you are using narcotic medications, you should take a dose prior to therapy to maximize you participation.  IF YOU ARE ON NARCOTIC MEDICATIONS IT IS NOT PERMISSIBLE TO OPERATE A MOTOR VEHICLE (MOTORCYCLE/CAR/TRUCK/MOPED) OR HEAVY MACHINERY DO NOT MIX NARCOTICS WITH OTHER CNS (CENTRAL NERVOUS SYSTEM) DEPRESSANTS SUCH AS ALCOHOL   STOP SMOKING OR USING NICOTINE PRODUCTS!!!!  As discussed nicotine severely impairs your body's ability to heal surgical and traumatic wounds but also impairs bone healing.  Wounds and bone heal by forming microscopic blood vessels (angiogenesis) and nicotine is a vasoconstrictor (essentially, shrinks blood vessels).  Therefore, if vasoconstriction occurs to these microscopic blood vessels they essentially disappear and are unable to deliver necessary nutrients to the healing tissue.  This is one modifiable factor that you can do to dramatically increase your chances of healing your injury.    (This means no smoking, no nicotine gum, patches, etc)  DO NOT USE NONSTEROIDAL ANTI-INFLAMMATORY DRUGS (NSAID'S)  Using products such as Advil (ibuprofen), Aleve (naproxen), Motrin (ibuprofen) for additional pain control during fracture healing can delay and/or prevent the healing response.  If you would like to take over the  counter (OTC) medication, Tylenol (acetaminophen) is ok.  However, some narcotic medications that are given for pain control contain acetaminophen as well. Therefore, you should not exceed more than 4000 mg of tylenol in a day if you do not have liver disease.  Also note that there are may OTC medicines, such as cold medicines and allergy medicines that my contain tylenol as well.  If you have any questions about medications and/or interactions please ask your doctor/PA or your pharmacist.      ICE AND ELEVATE INJURED/OPERATIVE EXTREMITY  Using ice and elevating the injured extremity above your heart can help with swelling and pain control.  Icing in a pulsatile fashion, such as 20 minutes on and 20 minutes off, can be followed.    Do not place ice directly on skin. Make sure there is a barrier between to skin and the ice pack.    Using frozen items such as frozen peas works well as the conform nicely to the are that needs to be iced.  USE AN ACE WRAP OR TED HOSE FOR SWELLING CONTROL  In addition to icing and elevation, Ace wraps or TED hose are used to help limit and resolve swelling.  It is recommended to use Ace wraps or TED hose until you are informed to stop.    When using Ace Wraps start the wrapping distally (farthest away from the body) and wrap proximally (closer to the body)   Example: If you had surgery on your leg or thing and you do not have a splint on, start the ace wrap at the toes and work your way up to the thigh        If you had surgery on your upper extremity and do not have a splint on, start the ace wrap at your fingers and work your way up to the upper arm  IF YOU ARE IN A SPLINT OR CAST DO NOT REMOVE IT FOR ANY REASON   If your splint gets wet for any reason please contact the office immediately. You may shower in your splint or cast as long as you keep it dry.  This can be done by wrapping in a cast cover or garbage back (or similar)  Do Not stick any thing down your splint  or cast such as pencils, money, or hangers to try and scratch yourself with.  If you feel itchy take benadryl as prescribed on the bottle for itching  IF YOU ARE IN A CAM BOOT (BLACK BOOT)  You may remove boot periodically. Perform daily dressing changes as noted below.  Wash the liner of the boot regularly and wear a sock when wearing the boot. It is recommended that you sleep in the boot until told otherwise    Call office for the following:  Temperature greater than 101F  Persistent nausea and vomiting  Severe uncontrolled pain  Redness, tenderness, or signs of infection (pain, swelling, redness, odor or green/yellow discharge around the site)  Difficulty breathing, headache or visual disturbances  Hives  Persistent dizziness or light-headedness  Extreme fatigue  Any other questions or concerns you may have after discharge  In an emergency, call 911 or go to an Emergency Department at a nearby hospital  HELPFUL INFORMATION  ?  If you had a block, it will wear off between 8-24 hrs postop typically.  This is period when your pain may go from nearly zero to the pain you would have had postop without the block.  This is an abrupt transition but nothing dangerous is happening.  You may take an extra dose of narcotic when this happens.  ? You should wean off your narcotic medicines as soon as you are able.  Most patients will be off or using minimal narcotics before their first postop appointment.   ? We suggest you use the pain medication the first night prior to going to bed, in order to ease any pain when the anesthesia wears off. You should avoid taking pain medications on an empty stomach as it will make you nauseous.  ? Do not drink alcoholic beverages or take illicit drugs when taking pain medications.  ? In most states it is against the law to drive while you are in a splint or sling.  And certainly against the law to drive while taking narcotics.  ? You may return to  work/school in the next couple of days when you feel up to it.   ? Pain medication may make you constipated.  Below are a few solutions to try in this order: - Decrease the amount of pain medication if you aren't having pain. - Drink lots of decaffeinated fluids. - Drink prune juice and/or each dried prunes  o If the first 3 don't work start with additional solutions - Take Colace - an over-the-counter stool softener - Take Senokot - an over-the-counter laxative - Take Miralax - a stronger over-the-counter laxative     CALL THE OFFICE WITH ANY QUESTIONS OR CONCERNS: [phone]   VISIT OUR WEBSITE FOR ADDITIONAL INFORMATION: [email or web address]

## 2020-04-20 NOTE — Anesthesia Preprocedure Evaluation (Addendum)
Anesthesia Evaluation  Patient identified by MRN, date of birth, ID band Patient awake    Reviewed: Allergy & Precautions, NPO status , Patient's Chart, lab work & pertinent test results  Airway Mallampati: II  TM Distance: >3 FB Neck ROM: Full    Dental no notable dental hx. (+) Teeth Intact, Dental Advisory Given   Pulmonary neg pulmonary ROS,    Pulmonary exam normal breath sounds clear to auscultation       Cardiovascular negative cardio ROS Normal cardiovascular exam Rhythm:Regular Rate:Normal     Neuro/Psych negative neurological ROS  negative psych ROS   GI/Hepatic negative GI ROS, Neg liver ROS,   Endo/Other  negative endocrine ROS  Renal/GU negative Renal ROS  negative genitourinary   Musculoskeletal Right mid-clavicular fx s/p fall   Abdominal   Peds negative pediatric ROS (+)  Hematology negative hematology ROS (+)   Anesthesia Other Findings Spanish speaking only  Reproductive/Obstetrics negative OB ROS                            Anesthesia Physical Anesthesia Plan  ASA: I  Anesthesia Plan: General   Post-op Pain Management:    Induction: Intravenous  PONV Risk Score and Plan: 3 and Ondansetron, Dexamethasone, Midazolam and Treatment may vary due to age or medical condition  Airway Management Planned: Oral ETT  Additional Equipment: None  Intra-op Plan:   Post-operative Plan: Extubation in OR  Informed Consent: I have reviewed the patients History and Physical, chart, labs and discussed the procedure including the risks, benefits and alternatives for the proposed anesthesia with the patient or authorized representative who has indicated his/her understanding and acceptance.     Dental advisory given  Plan Discussed with: CRNA  Anesthesia Plan Comments:         Anesthesia Quick Evaluation

## 2020-04-20 NOTE — Op Note (Signed)
NAME: Alexandra Whitaker MEDICAL RECORD YO:[id number] ACCOUNT NO. DATE OF BIRTH:04/12/1991 FACILITY: [facility] PHYSICIAN:Mclain Freer H. Carmen Vallecillo, MD  OPERATIVE REPORT  DATE OF PROCEDURE:  04/20/2020  PREOPERATIVE DIAGNOSES:   Closed right clavicle fracture.  POSTOPERATIVE DIAGNOSES:   Closed right clavicle fracture.  PROCEDURES: Open reduction internal fixation of right clavicle with Acumed plate.  SURGEON:  Myrene Galas, MD  ASSISTANT:  Montez Morita, PA-C.  ANESTHESIA:  General.  COMPLICATIONS:  None.  TOURNIQUET:  N/A  DISPOSITION:  To PACU.  CONDITION:  Stable.  INDICATIONS FOR PROCEDURE:  The patient is a 29 y.o. who was injured in fall down the stairs.  I discussed with the patient  preoperatively the risks and benefits of surgery including the  possibility of failure to prevent infection, DVT, PE, symptomatic hardware, infraclavicular numbness, malunion, nonunion, need for further surgery and multiple others. The patient acknowledged these risks and provided consent to proceed.  SUMMARY OF PROCEDURE:  Ancef was given preoperatively and general anesthesia induced in the operating room.  The clavicular area from the neck to the sternum to the axilla was prepped and draped in the usual sterile fashion.  A superior approach was made after a timeout, carrying dissection carefully down to the periosteum, which was left intact to the bone fragments.  There was massive amount of shortening. The bone ends were identified and cleaned of hematoma and reduced by traction.  I was unable to secure lag fixation effectively because of transverse fracture plane.  Consequently, I used a clamp to maintain provisional reduction while a bridge plate was applied using longest possible construct and securing 3+ bicortical screws on either side of the fracture, using standard fixation medially and combined standard and locked fixation laterally. The head of the lateral posterior 2.7 locked screw (which was the  more medial of the two) did strip but was fully engaged and so left in place.  Final images including caudal and cephalic tilt and AP views showed restoration of length, alignment, rotation and excellent position of all hardware.  Wound was irrigated thoroughly.  Montez Morita, PA-C, assisted me throughout and assistance was necessary to maintain control of the fracture and assist with exposure and instrumentation.  Layered closure was performed and a sterile gently compressive dressing applied.  PROGNOSIS:  The patient will be weightbearing as tolerated through the left upper extremity with a sling for comfort. Gentle PROM and AROM of the shoulder and elbe may begin immediately with the assistance of PT and OT. Follow up in 10-14 days post discharge.

## 2020-04-20 NOTE — Anesthesia Procedure Notes (Signed)
Procedure Name: Intubation Date/Time: 04/20/2020 10:08 AM Performed by: Leonor Liv, CRNA Pre-anesthesia Checklist: Patient identified, Emergency Drugs available, Suction available and Patient being monitored Patient Re-evaluated:Patient Re-evaluated prior to induction Oxygen Delivery Method: Circle System Utilized Preoxygenation: Pre-oxygenation with 100% oxygen Induction Type: IV induction Ventilation: Mask ventilation without difficulty Laryngoscope Size: Mac and 3 Grade View: Grade I Tube type: Oral Tube size: 7.0 mm Number of attempts: 1 Airway Equipment and Method: Stylet and Oral airway Placement Confirmation: ETT inserted through vocal cords under direct vision,  positive ETCO2 and breath sounds checked- equal and bilateral Secured at: 21 cm Tube secured with: Tape Dental Injury: Teeth and Oropharynx as per pre-operative assessment

## 2020-04-21 ENCOUNTER — Encounter (HOSPITAL_COMMUNITY): Payer: Self-pay | Admitting: Orthopedic Surgery

## 2021-05-16 IMAGING — CT CT SHOULDER*R* W/O CM
2 series · 15 of 20 positions shown, 18 images · non-contrast
Comparison: Radiographs, same date.

CLINICAL DATA: Right shoulder trauma. Evaluate scapula and clavicle
fractures.

EXAM:
CT OF THE UPPER RIGHT EXTREMITY WITHOUT CONTRAST
TECHNIQUE: Multidetector CT imaging of the upper right extremity was performed
according to the standard protocol.

[Series 6: soft tissue · axial · 0.45mm/px · z∈[-457,-305]mm · 12 of 90 slices shown, 15 images]
[im 7/90  soft-tissue]
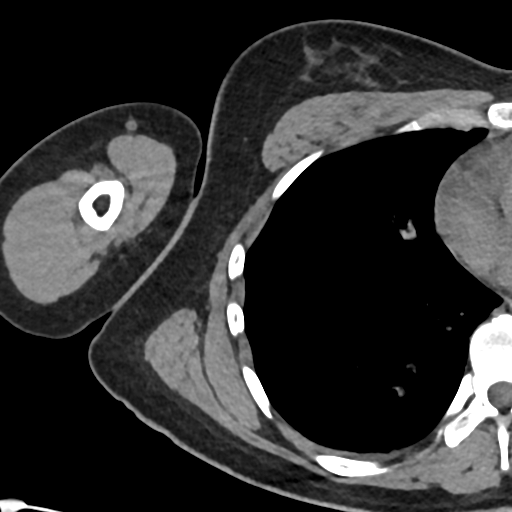
[im 7/90  bone]
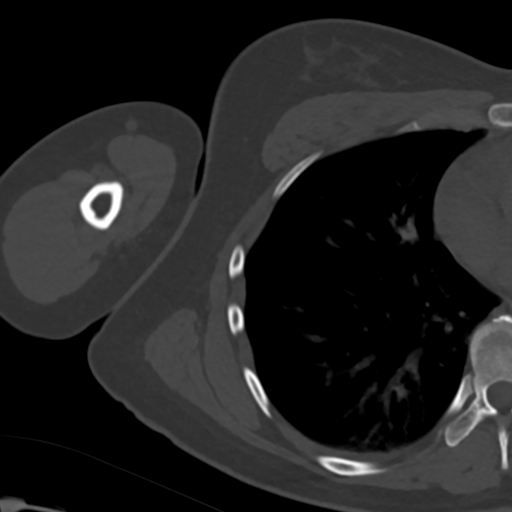
[im 14/90  bone]
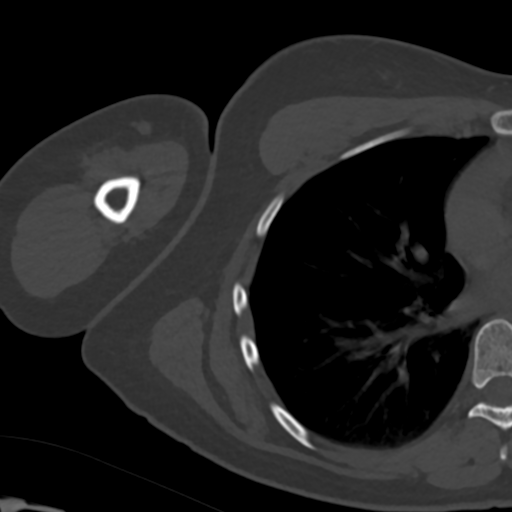
[im 21/90  bone]
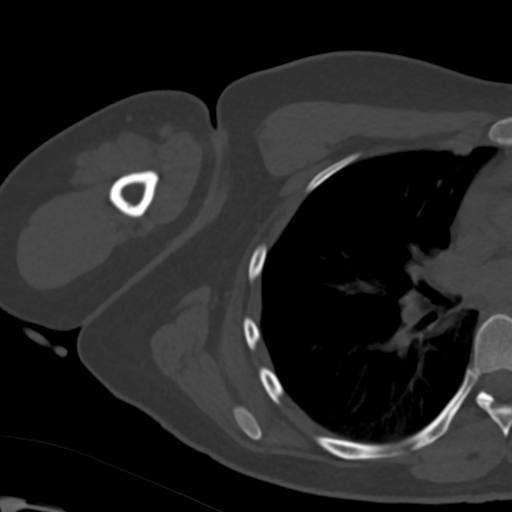
[im 28/90  bone]
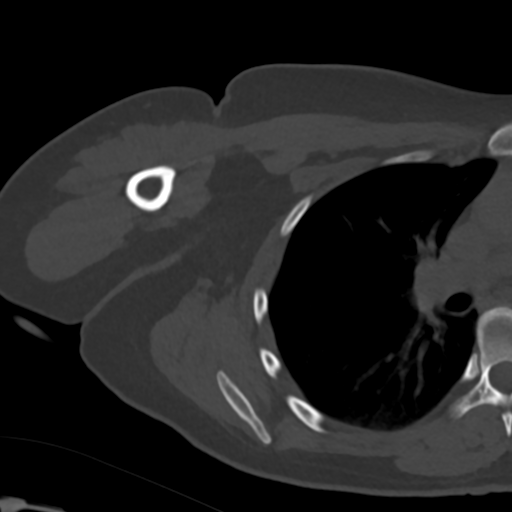
[im 35/90  soft-tissue]
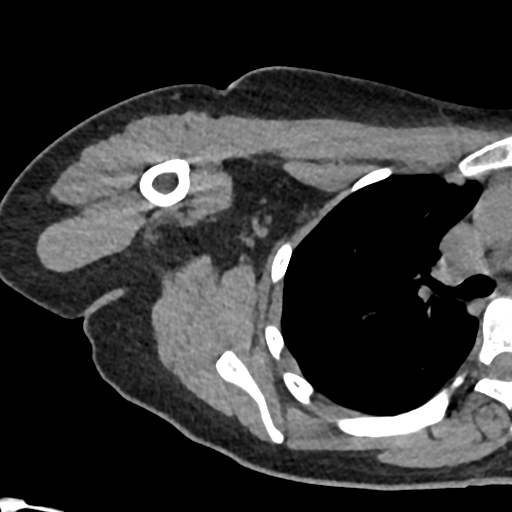
[im 35/90  bone]
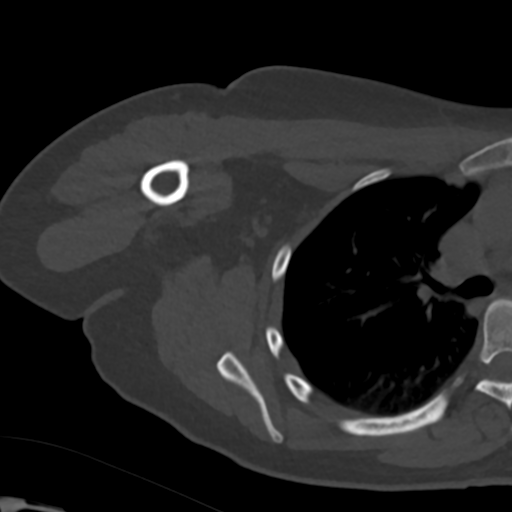
[im 42/90  bone]
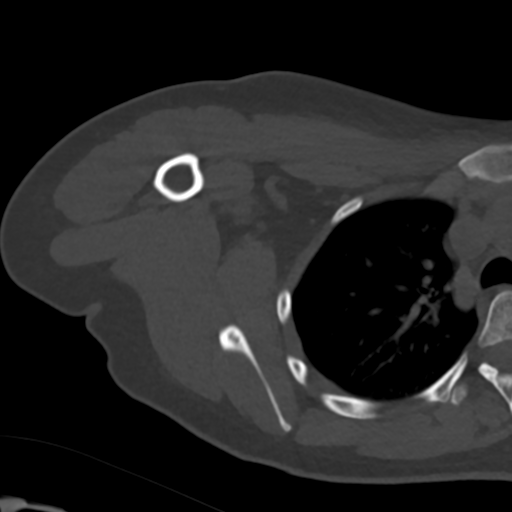
[im 48/90  bone]
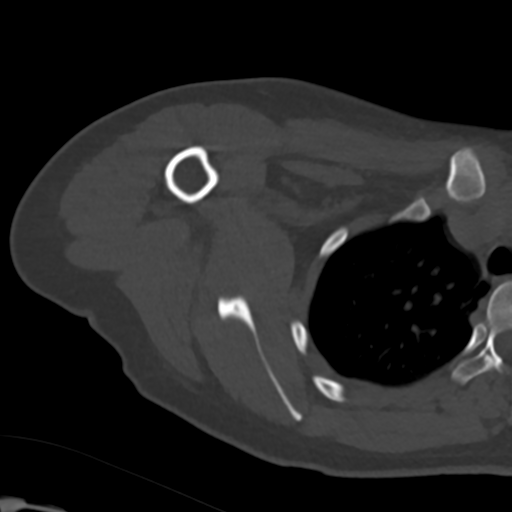
[im 55/90  bone]
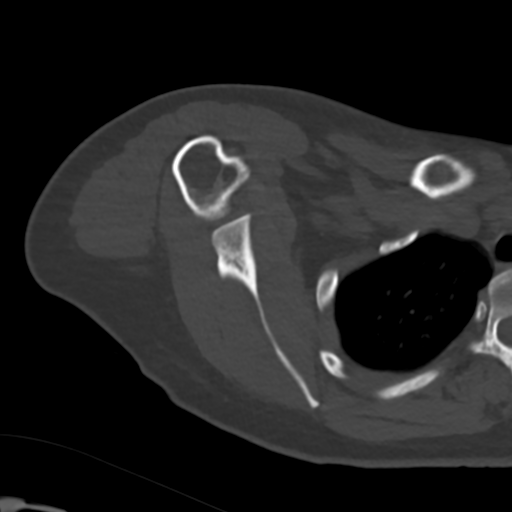
[im 62/90  soft-tissue]
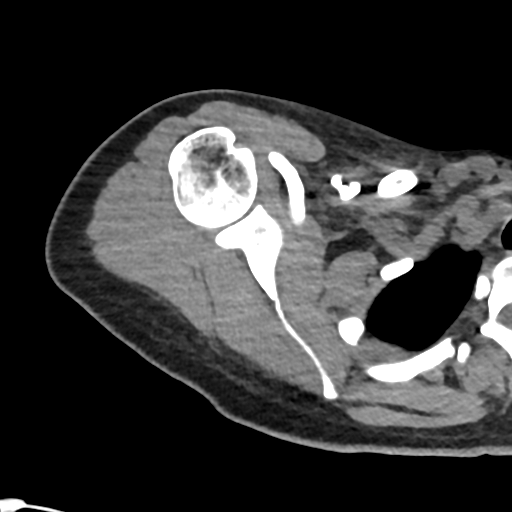
[im 62/90  bone]
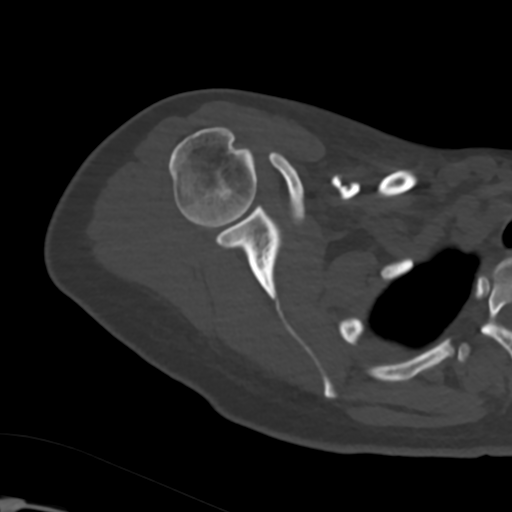
[im 69/90  bone]
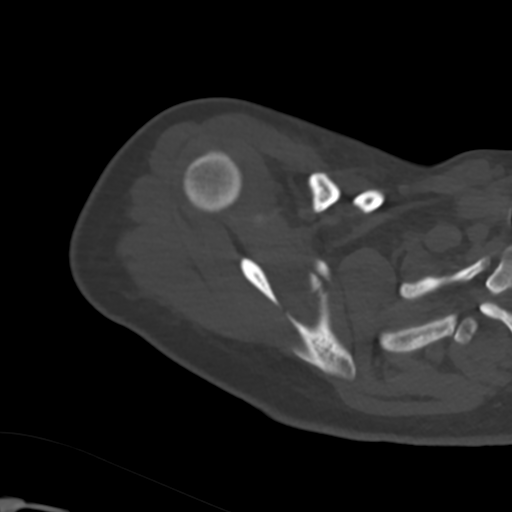
[im 76/90  bone]
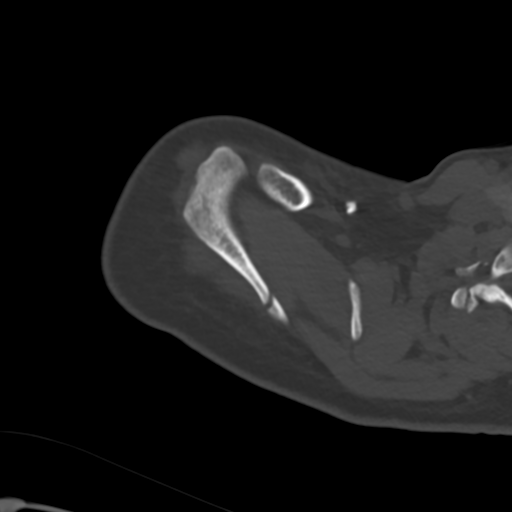
[im 83/90  bone]
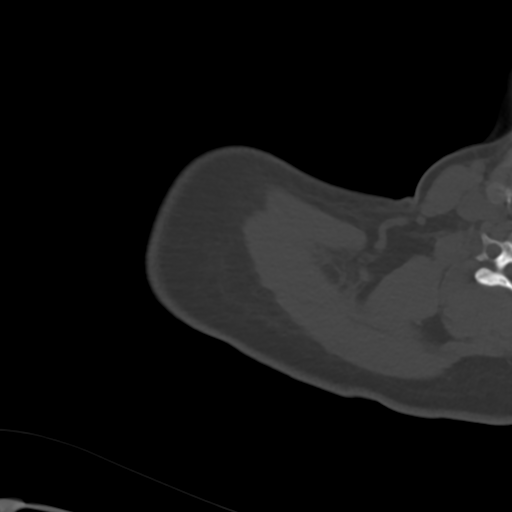

[Series 10: sag soft · coronal · 0.40mm/px · 3 of 97 slices shown]
[im 20/97  bone]
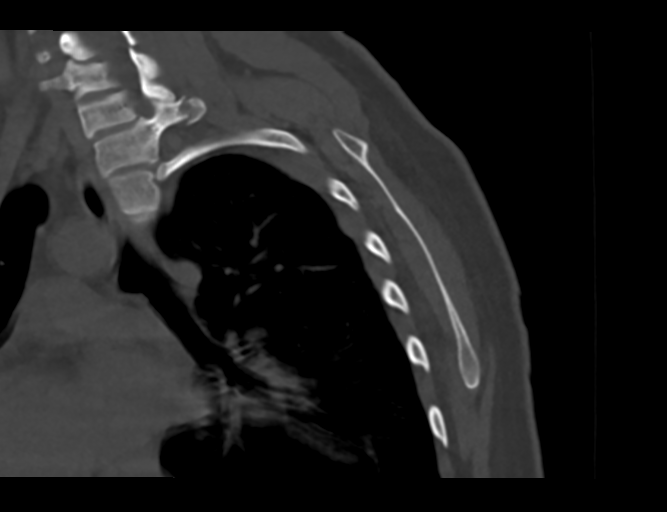
[im 39/97  bone]
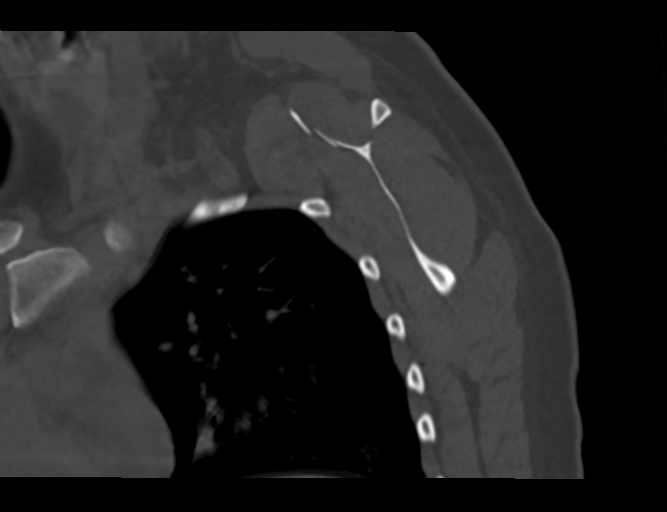
[im 58/97  bone]
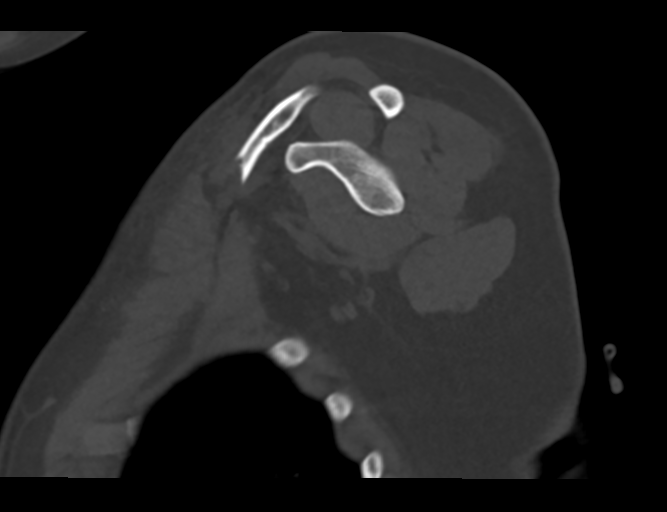

[15 of 20 positions shown; findings below may reference images not displayed]

FINDINGS: Displaced mid right clavicle fracture is again demonstrated. Marked
elevation of the proximal fragment. The AC joint is intact and the
coracoclavicular ligaments are intact. The fragments are overlapping
by approximately 2.4 cm.

Nondisplaced fracture involving the scapular spine. There is also a
mildly comminuted but nondisplaced fracture of the scapular body.

No definite rib fractures are identified but there are nondisplaced
fractures involving the right transverse processes of C7, T1 and T2.

Grossly by CT the rotator cuff tendons are intact. The biceps tendon
appears intact.

The right lung is clear.  No pulmonary contusion or pneumothorax.
IMPRESSION: 1. Displaced mid right clavicle fracture. Marked elevation of the
proximal fragment.
2. Nondisplaced fracture involving the scapular spine.
3. Mildly comminuted but nondisplaced fracture of the scapular body.
4. Nondisplaced fractures involving the right transverse processes
of C7, T1 and T2.
5. Grossly by CT the rotator cuff tendons are intact.

## 2021-06-09 IMAGING — DX DG CLAVICLE*R*
1 series · 2 of 2 positions shown · non-contrast
Comparison: 03/27/2020

CLINICAL DATA: Post ORIF RIGHT clavicle

EXAM:
RIGHT CLAVICLE - 2+ VIEWS

[Series 1: clavicle · 0.14mm/px · 2 of 2 slices shown]
[im 1/2]
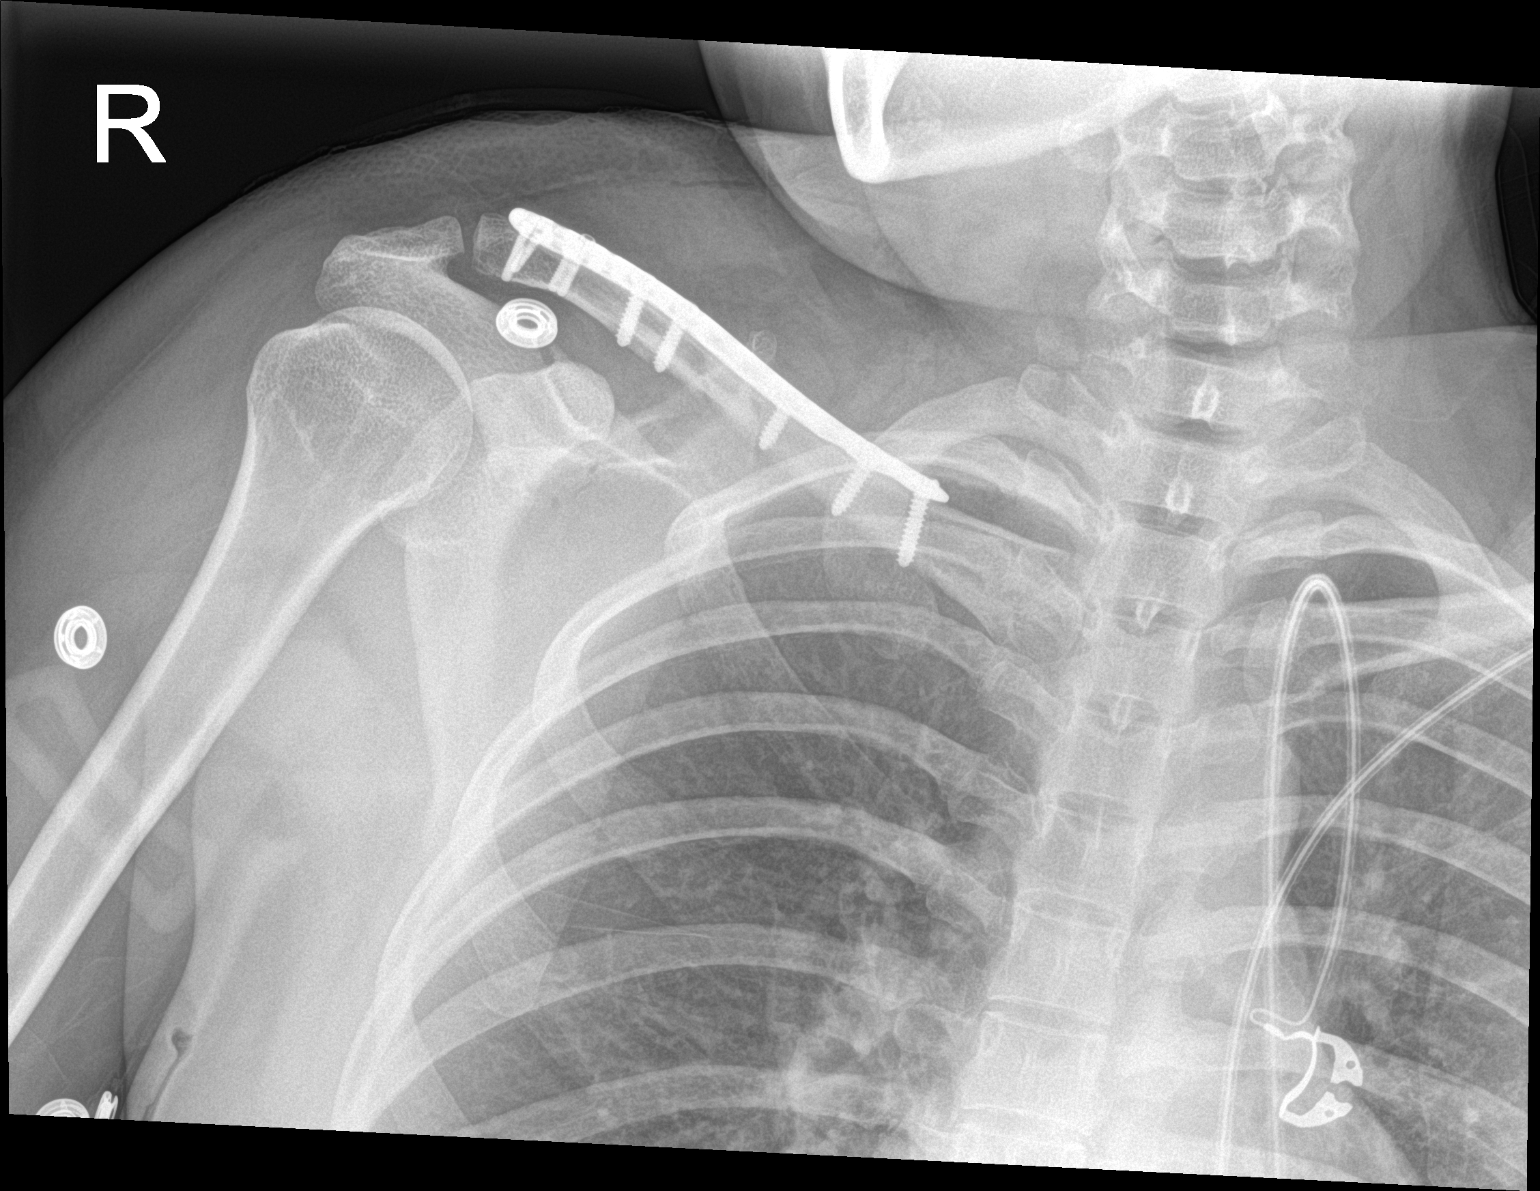
[im 2/2]
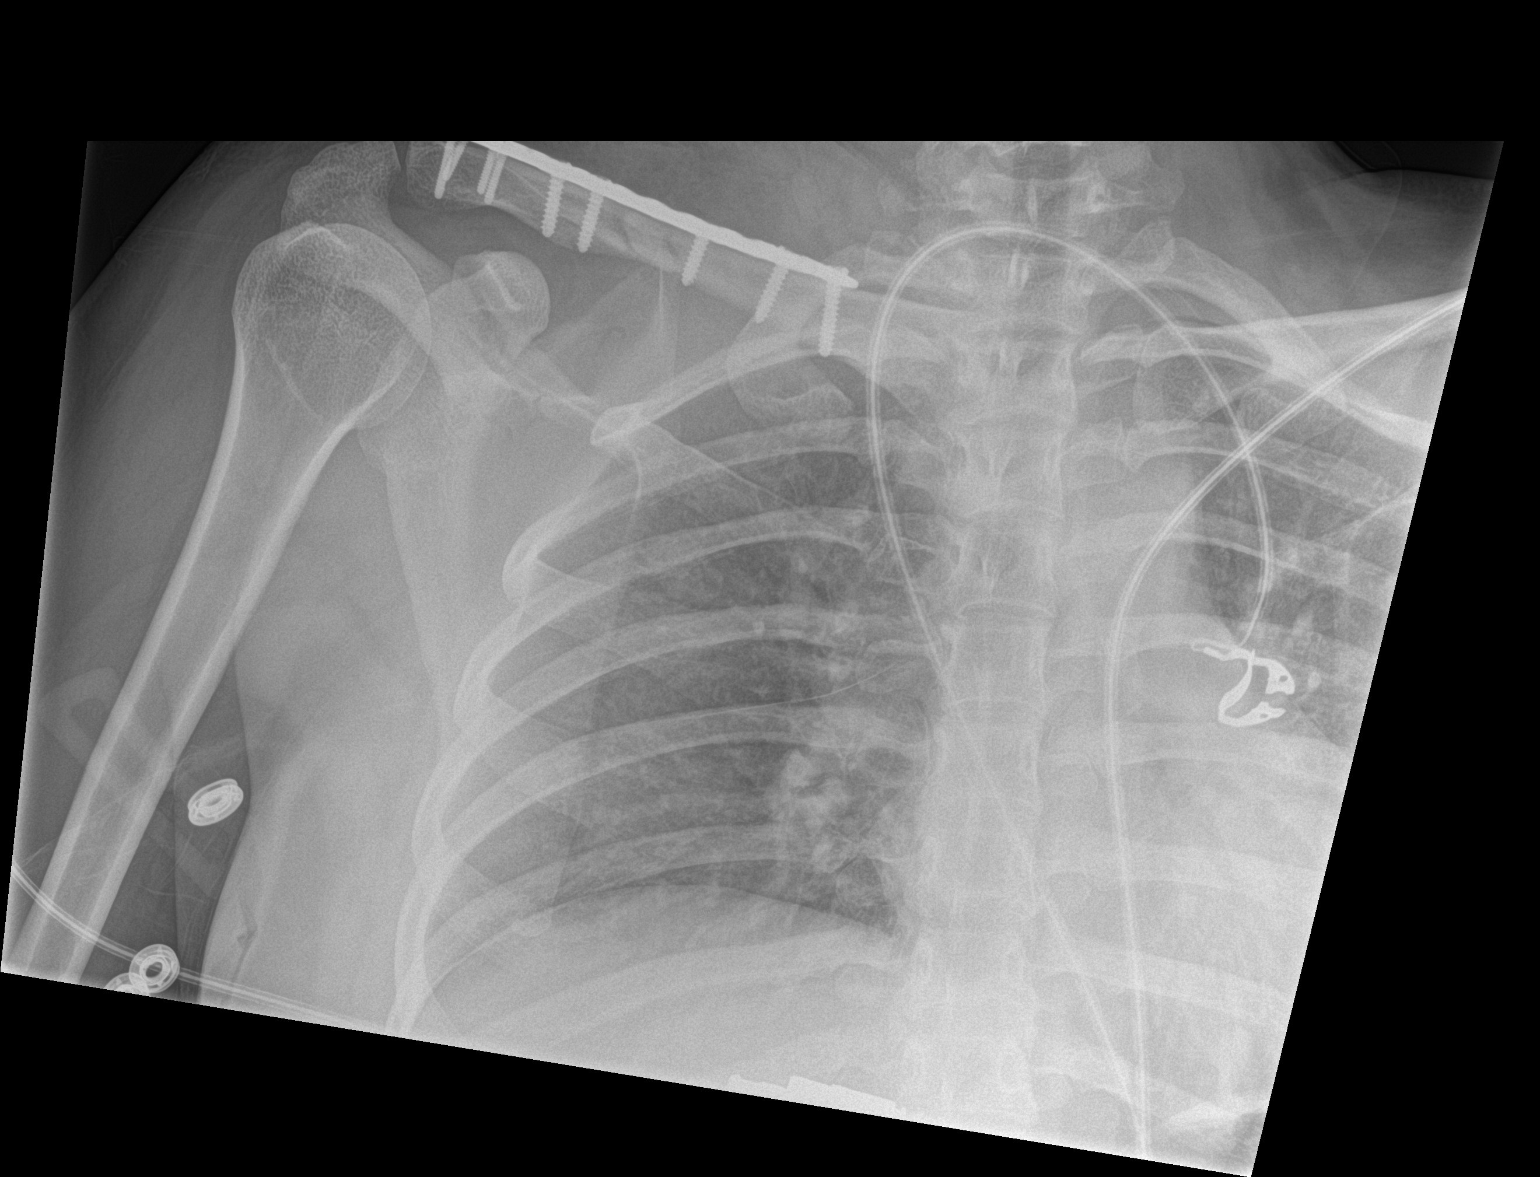

[2 of 2 positions shown; findings below may reference images not displayed]

FINDINGS: Interval reduction of previously identified displaced RIGHT
clavicular fracture with placement of a plate and 9 screws.

Sternoclavicular and acromioclavicular joint alignments appear
normal.

No additional osseous abnormality seen.
IMPRESSION: Post ORIF of previously identified mid RIGHT clavicular fracture.
